# Patient Record
Sex: Male | Born: 1965 | Race: Black or African American | Hispanic: No | Marital: Married | Smoking: Current every day smoker
Health system: Southern US, Community
[De-identification: ages and names within clinical notes are randomized; demographics above are authoritative.]

## PROBLEM LIST (undated history)

## (undated) DIAGNOSIS — I10 Essential (primary) hypertension: Secondary | ICD-10-CM

## (undated) DIAGNOSIS — E785 Hyperlipidemia, unspecified: Secondary | ICD-10-CM

## (undated) HISTORY — DX: Essential (primary) hypertension: I10

## (undated) HISTORY — DX: Hyperlipidemia, unspecified: E78.5

---

## 2005-09-02 ENCOUNTER — Other Ambulatory Visit: Payer: Self-pay

## 2005-09-02 ENCOUNTER — Emergency Department: Payer: Self-pay | Admitting: Emergency Medicine

## 2007-04-15 ENCOUNTER — Emergency Department: Payer: Self-pay | Admitting: Emergency Medicine

## 2007-04-16 ENCOUNTER — Emergency Department: Payer: Self-pay | Admitting: Emergency Medicine

## 2007-04-23 ENCOUNTER — Emergency Department: Payer: Self-pay | Admitting: Emergency Medicine

## 2008-05-13 ENCOUNTER — Emergency Department: Payer: Self-pay | Admitting: Unknown Physician Specialty

## 2009-05-01 ENCOUNTER — Emergency Department: Payer: Self-pay | Admitting: Unknown Physician Specialty

## 2014-06-10 ENCOUNTER — Emergency Department: Payer: Self-pay | Admitting: Emergency Medicine

## 2014-11-16 NOTE — Consult Note (Signed)
Brief Consult Note: Diagnosis: Displaced right Colles fracture.   Patient was seen by consultant.   Recommend to proceed with surgery or procedure.   Recommend further assessment or treatment.   Orders entered.   Discussed with Attending MD.   Comments: 49 year old male fell yesterday in17juring the right wrist.  Presented to Emergency Room today wr=here exam and X-rays show a displaced right distal radius fracture.    Exam:  Alert and cooperative.  circulation/sensation/motor function good.  mild deformity.  Tendeer to palpation.  Skin intact.  No median nerve signs.   X-rays: as above  Rx:  closed reduction with sugar tong splint---- post reduction X-rays show good length, no angulation,  slight posterior offset         Ice, elevate, Norco 7.5/325 mg        re-ray 3-4 days.  Advised patient that surgery might be necessary if fracture moves.  Electronic Signatures: Valinda HoarMiller, Sonoma Firkus E (MD)  (Signed [id number]-Nov-15 15:30)  Authored: Brief Consult Note   Last Updated: 16-Nov-15 15:30 by Valinda HoarMiller, Rashidi Loh E (MD)

## 2016-07-12 IMAGING — CR DG WRIST COMPLETE 3+V*R*
1 series · 4 of 4 positions shown · non-contrast
Comparison: None.

CLINICAL DATA: Fell down a flight of stairs.  Right wrist pain.

EXAM:
RIGHT WRIST - COMPLETE 3+ VIEW

[Series 1: dxr wrist rt comp with obliques · 0.14mm/px · 4 of 4 slices shown]
[im 1/4]
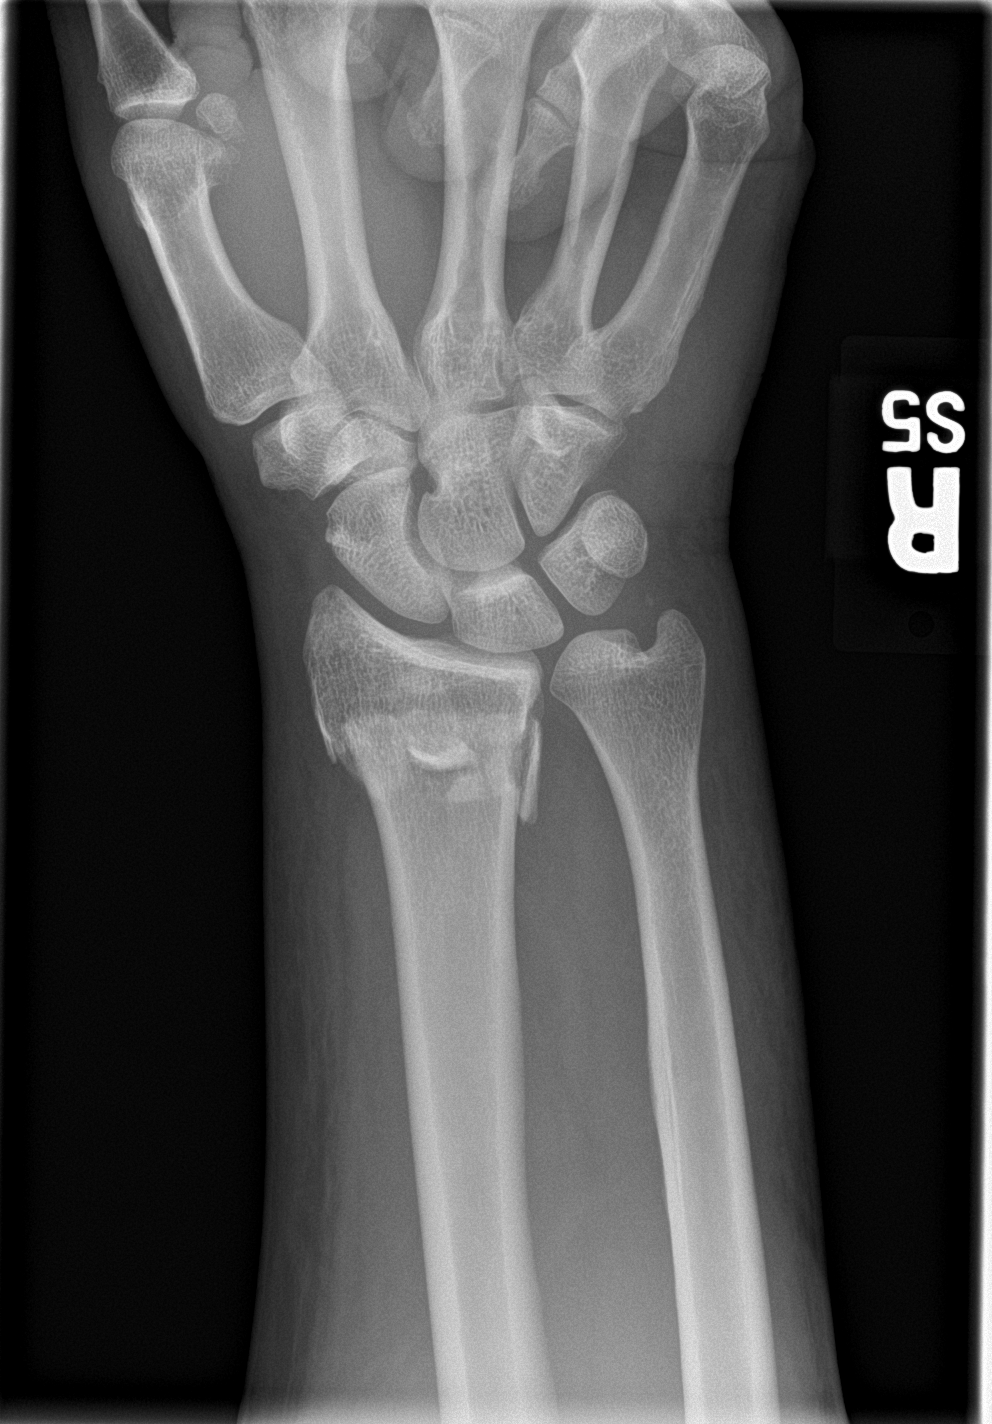
[im 2/4]
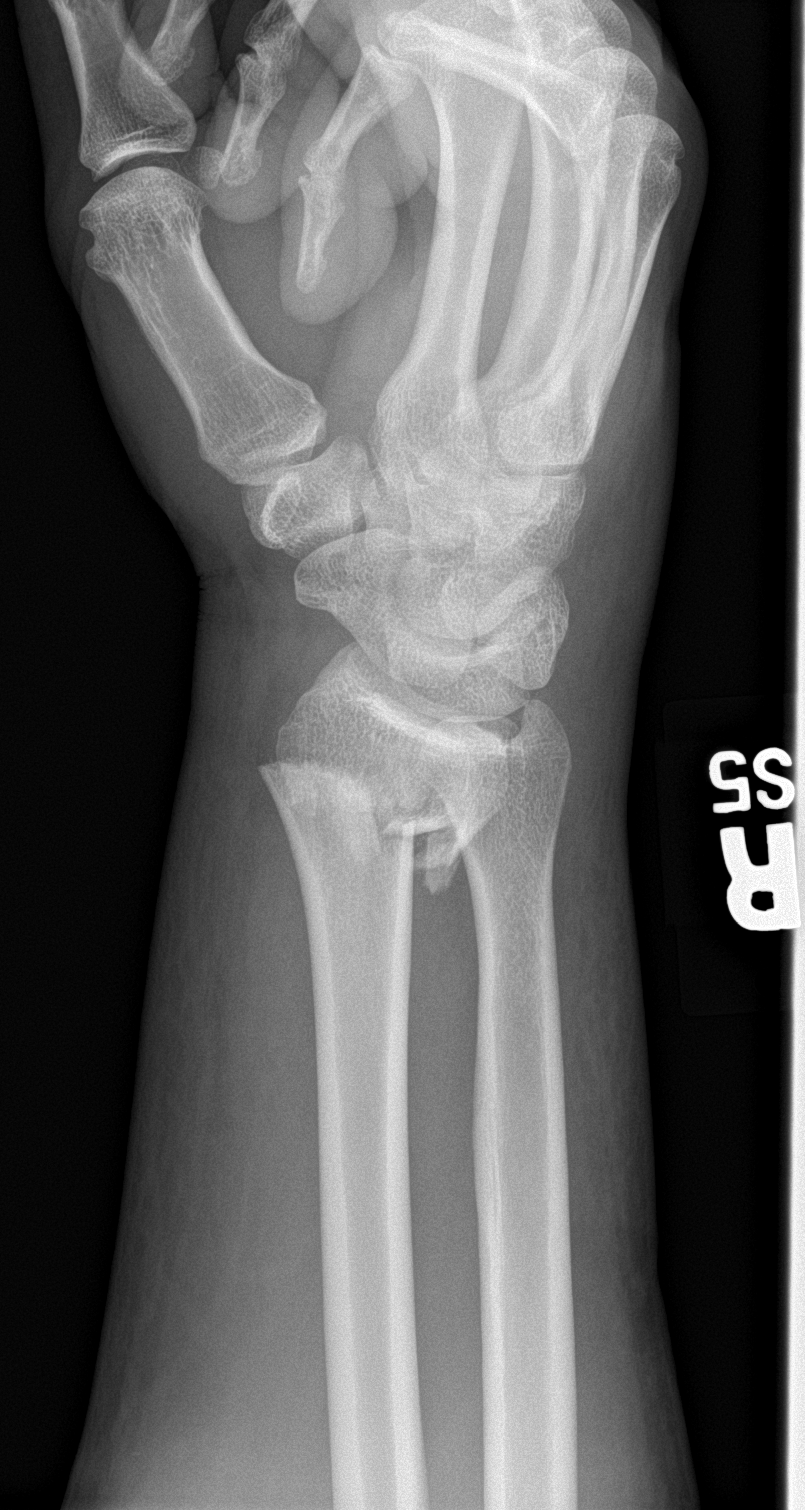
[im 3/4]
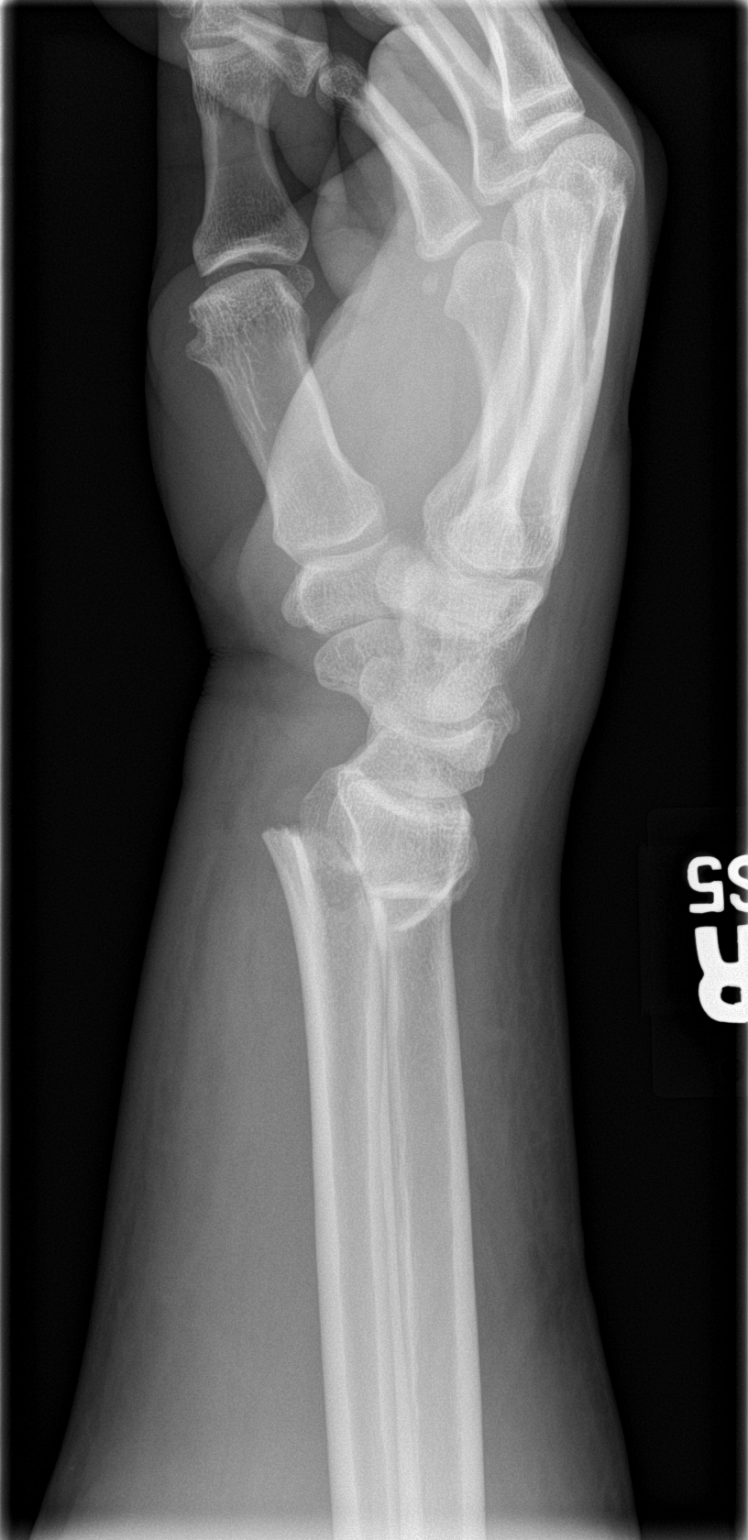
[im 4/4]
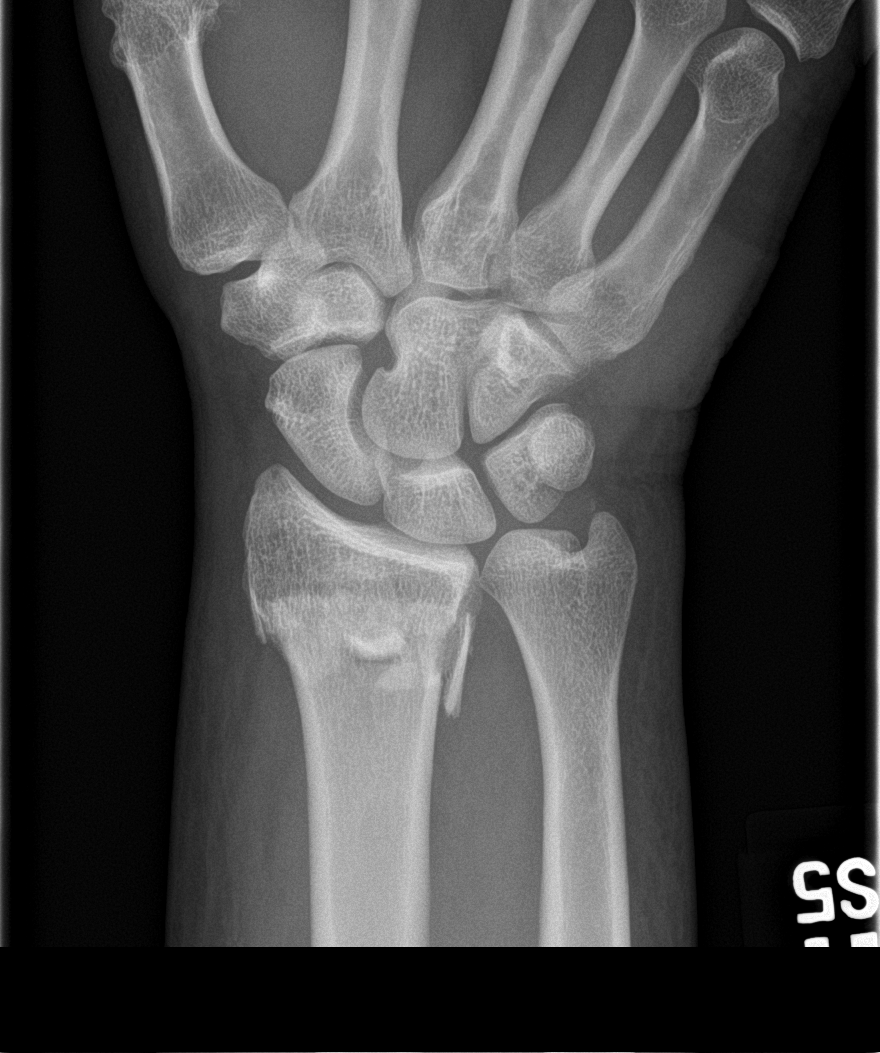

[4 of 4 positions shown; findings below may reference images not displayed]

FINDINGS: There is a comminuted fracture of the distal radial metaphysis. The
areas 6 mm of posterior displacement. There is apex volar angulation
with dorsal tilting of the distal radial major fracture fragment.
There is a tiny ossific fragment just distal to the tip of the ulnar
styloid process which likely reflects a avulsion fracture. There is
no other fracture or dislocation. There is soft tissue swelling
around the right wrist.
IMPRESSION: 1. Comminuted fracture of the distal right radial metaphysis with 6
mm of posterior displacement and apex volar angulation.
2. Tiny avulsive fracture fragment at the tip of the ulnar styloid
process.

## 2016-07-12 IMAGING — CR DG WRIST 2V*R*
1 series · 2 of 2 positions shown · non-contrast
Comparison: 06/10/2014

CLINICAL DATA: Status post reduction

EXAM:
RIGHT WRIST - 2 VIEW

[Series 1: pa · 0.17mm/px · 2 of 2 slices shown]
[im 1/2]
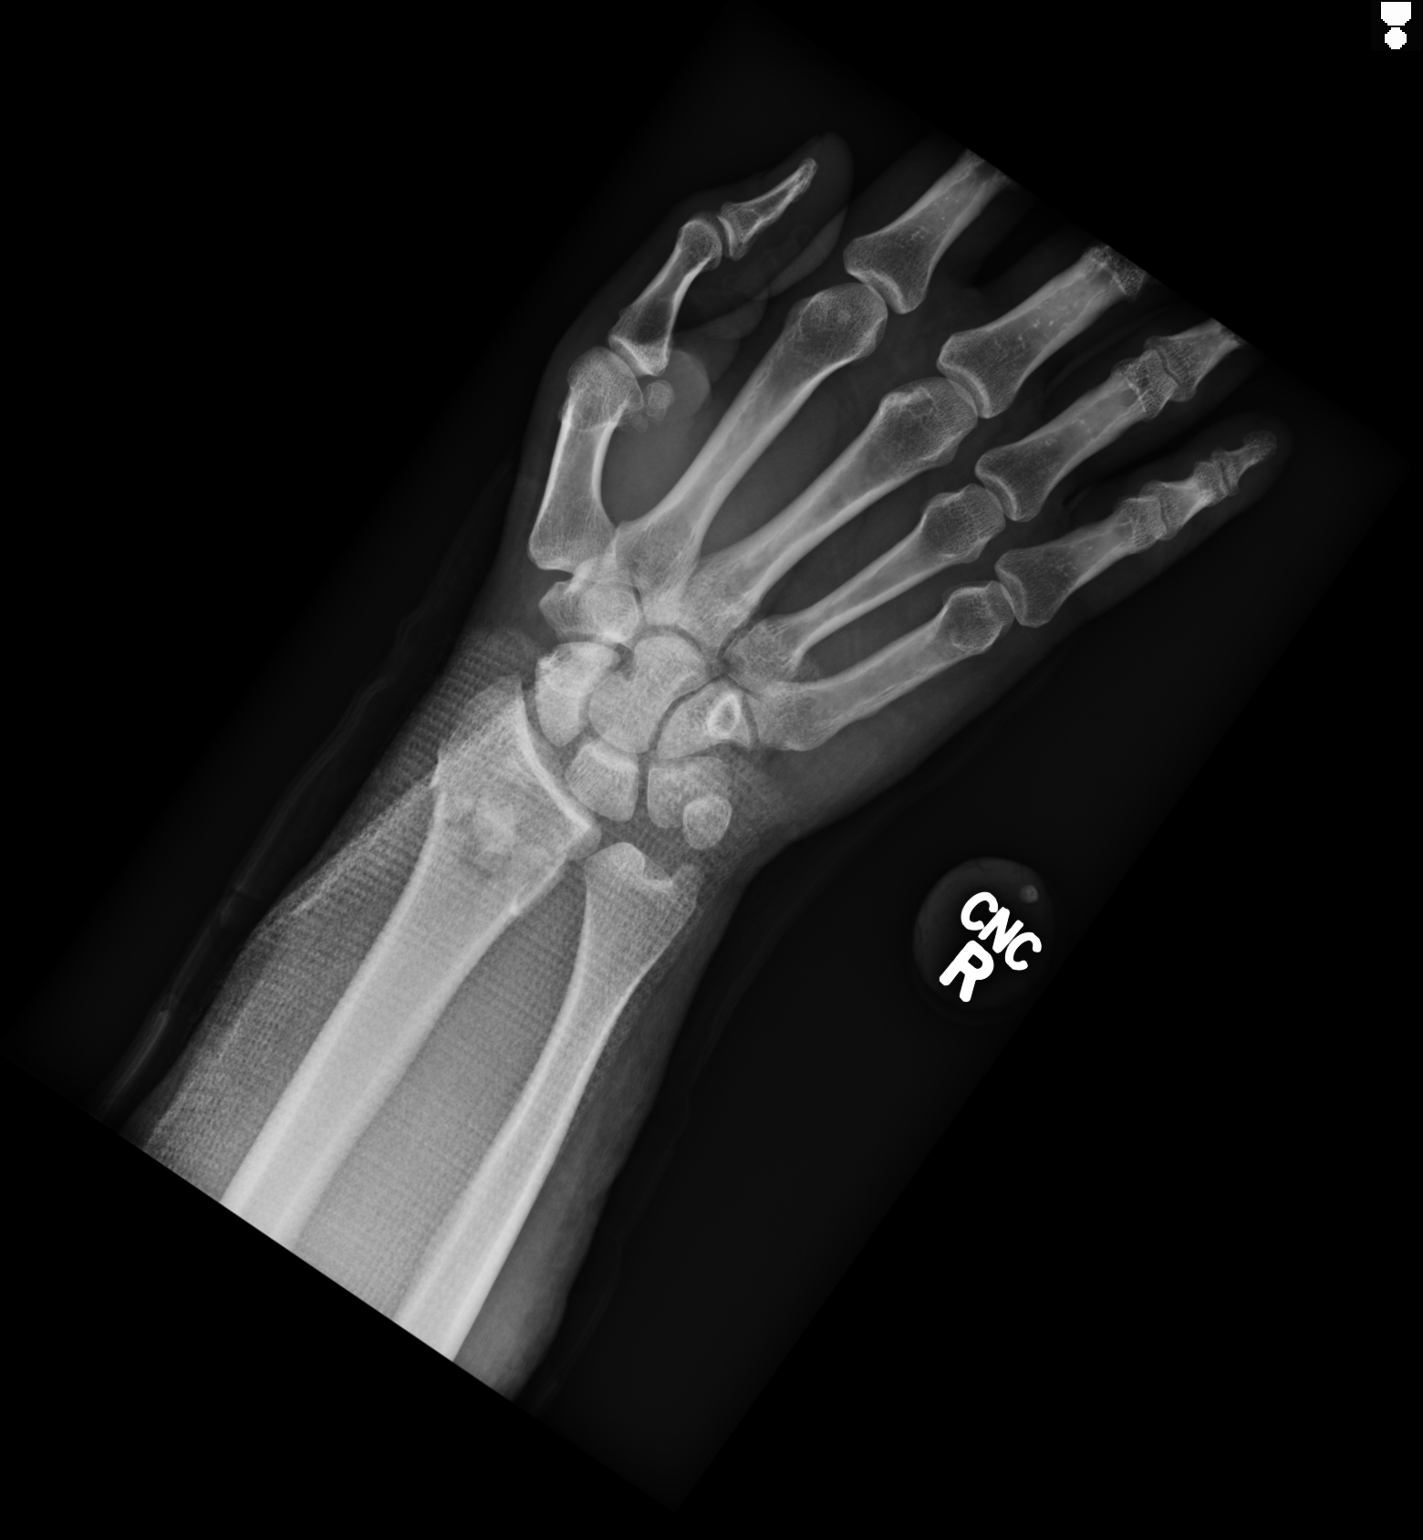
[im 2/2]
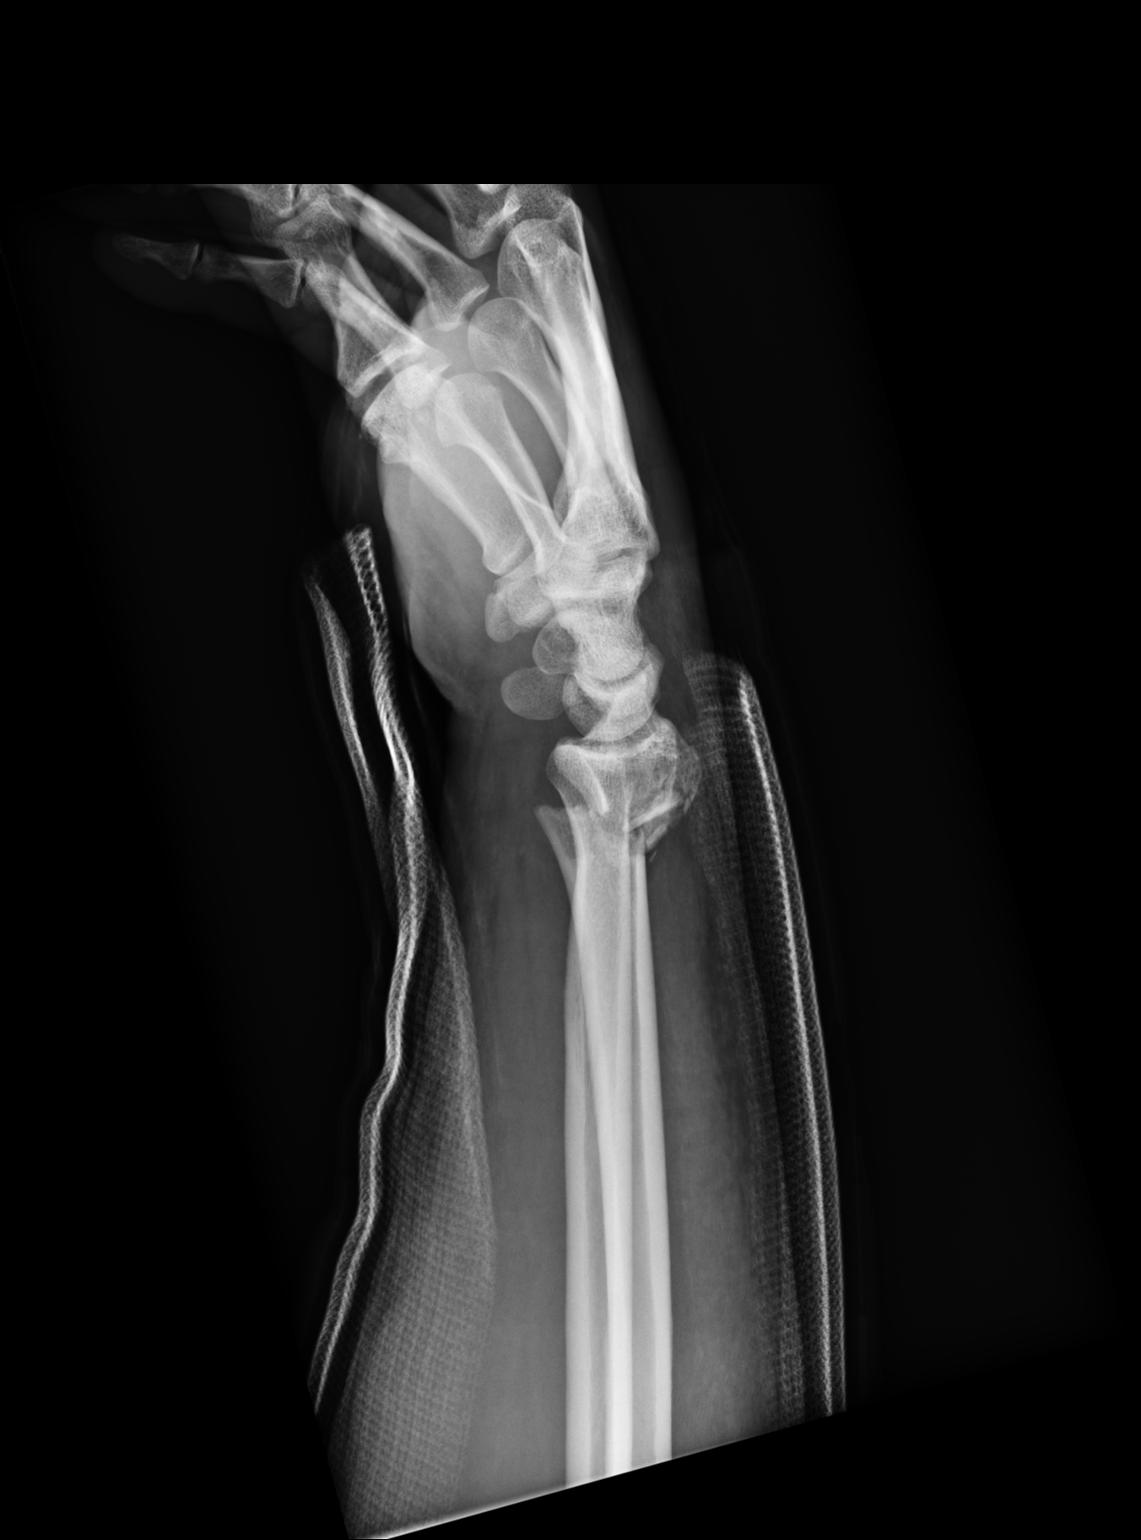

[2 of 2 positions shown; findings below may reference images not displayed]

FINDINGS: There is been interval close reduction of the comminuted, impacted
fracture involving the distal radius. There is continued dorsal
displacement of the distal fracture fragments.
IMPRESSION: 1. Status post attempted reduction of distal radius fracture.
Continued dorsal displacement of the distal fracture fragments
noted.

## 2022-11-17 NOTE — Progress Notes (Signed)
 Patient ID: Herbert Watson is a 57 y.o. male who presents for follow up of multiple medical problems. Informant: Patient came to appointment alone. Assessment/Plan:  Erectile dysfunction, unspecified erectile dysfunction type Assessment & Plan: Refilled Viagra 25 mg.  Patient given good Rx coupon.   Essential hypertension Assessment & Plan: Blood pressure still running a little bit high.  Discussed increasing amlodipine to 10 mg p.o. today.  For right now patient wants to maintain amlodipine at 5 mg p.o. daily and continue to work on low-sodium diet and exercise as well as weight loss.  Patient to get a blood pressure monitor at home and start checking blood pressure readings.  Requested patient send a MyChart messages with blood pressure readings so we can get under control.  Check electrolytes BUN/creatinine today.  Orders: -     Comprehensive Metabolic Panel -     amLODIPine; Take 1 tablet (5 mg total) by mouth daily.  Hyperlipidemia, unspecified hyperlipidemia type Assessment & Plan: Continue atorvastatin 20 mg p.o. daily.  Check lipid profile and ALT a today.  Orders: -     Lipid Panel -     atorvastatin; Take 1 tablet (20 mg total) by mouth daily.  Prediabetes Assessment & Plan: Work on diet and exercise.  Concerned that A1c is can be a little bit high today.  Will check labs today.  Cut out liquid calories and concentrated sweet beverages.  Orders: -     Hemoglobin A1c  Screening for blood disease -     CBC  High risk medication use -     Comprehensive Metabolic Panel  Tobacco abuse counseling Assessment & Plan: Patient has cut back dramatically however would like to have him stop.  Patient understands there are medications available to assist with cravings.  Currently not interested in nicotine replacement or medications.  Patient was aware of the 1 800 quit now line.  Continue to encourage patient to quit tobacco and inquire at each and every visit.  Orders: -      Ambulatory referral to Quit Line; Future  Screening for prostate cancer -     PSA (Prostate Specific Antigen); Future  Vaccine counseling Assessment & Plan: Due for Shingrix and Pneumovax.  Wants to defer to next visit.   Intermittent constipation Assessment & Plan: The patient's constipation may be a side effect of Norvasc. The patient is advised to take Benefiber, increase fluid intake, and consider consuming prune juice. A daily water intake of 60 ounces or 2 liters is recommended.   BMI 32.0-32.9,adult Assessment & Plan: The patient's Body Mass Index (BMI) is currently above the ideal range. The patient is advised to aim for a weight below 200 pounds, with a long-term goal of reaching 185 pounds. A trial period of 2 weeks without liquid calories is recommended. The patient is also encouraged to increase physical activity.   Other orders -     blood pressure monitor; 1 kit by Miscellaneous route in the morning. -     sildenafiL; Take 1 tablet (25 mg total) by mouth daily as needed for erectile dysfunction.   Preventive services addressed today Prevnar 20 (PCV20): deferred until later visit Shingrix: deferred until later visit Diabetes screening: ordered Lipid screening: ordered PSA: risks and benefits of prostate cancer screening were carefully reviewed -- ordered -- Patient verbalized an understanding of today's assessment and recommendations, as well as the purpose of ongoing medications. Return in about 6 months (around 05/20/2023).   Subjective:  HPI History of Present  Illness The patient presents for evaluation of multiple medical concerns.  The patient has been utilizing sildenafil 20 mg as needed, occasionally doubling the dose. He reports that the medication has been effective and is seeking a refill.  The patient has experienced a weight gain of 10 pounds, increasing from 199 to 210. His dietary habits include fast food consumption and a sedentary lifestyle.  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 His morning routine includes a a pack of Toasties, and a non-diet SunDrop. He also consumes 4 to 5 light beers per week. He denies any symptoms of hyperglycemia such as polydipsia or polyuria, except when consuming beer. His sleep quality was poor the previous night due to a disagreement with his wife, but he generally sleeps well. He admits to snoring, which has not worsened with the recent weight gain. He denies any episodes of apnea or daytime somnolence. He also denies experiencing headaches or dizziness upon awakening.  The patient's blood pressure readings have shown a decrease from 150/90 to 148/80. He does not currently own a home blood pressure monitor.  Supplemental Information He denies any shortness of breath, cough, chest pain, heart racing, nausea, or vomiting. His bowel movements are a little bit better, but he is constipated sometimes.  . He has cut back on smoking to 1 or 2 cigarettes per week.  This patients last WCC/CPE date: : 01/14/2021  Blood pressure still running a little bit high.  Hyperlipidemia atorvastatin (LIPITOR) 20 MG tablet   Erectile dysfunction  Prediabetes  Tobacco use ROS A comprehensive review of systems was conducted with negative results except as noted in the HPI above.      Objective:  Vital Signs BP 138/80   Pulse 84   Ht 172.2 cm (5' 7.8)   Wt 95.6 kg (210 lb 11.2 oz)   SpO2 98%   BMI 32.23 kg/m  Body mass index is 32.23 kg/m. Exam Physical Exam Constitutional:      Appearance: Normal appearance.  HENT:     Head: Normocephalic and atraumatic.  Cardiovascular:     Rate and Rhythm: Normal rate and regular rhythm.     Pulses: Normal pulses.     Heart sounds: Normal heart sounds.  Pulmonary:     Effort: Pulmonary effort is normal.     Breath sounds: Normal breath sounds.   Abdominal:     General: Abdomen is flat. Bowel sounds are normal.     Palpations: Abdomen is soft.  Musculoskeletal:     Right lower leg: No edema.     Left lower leg: No edema.  Lymphadenopathy:     Cervical: No cervical adenopathy.     Upper Body:     Right upper body: No supraclavicular or axillary adenopathy.     Left upper body: No supraclavicular or axillary adenopathy.  Skin:    General: Skin is warm.  Neurological:     General: No focal deficit present.     Mental Status: He is alert. Mental status is at baseline.  Psychiatric:        Mood and Affect: Mood normal.        Behavior: Behavior normal.        Thought Content: Thought content normal.        I've personally reviewed and summarized records in EPIC/Media and via CareEverywhere as well as medication, allergies, past medical, social, and family history.  I have  reviewed the patient's medical history in detail and updated the computerized patient record.  Note - This chart has been prepared using the Dragon voice recognition system. Typographical errors may have occurred.  Attempts have been made to correct errors, however, inadvertent errors may persist.

## 2024-03-14 NOTE — Progress Notes (Signed)
 " New patient visit  Patient: Herbert Watson   DOB: [birth year]   58 y.o. Male  MRN: [id number] Visit Date: 03/15/2024  Today's healthcare provider: Jolynn Spencer, PA-C   Chief Complaint  Patient presents with   New Patient (Initial Visit)    Patient is present to establish care with new provider due to last office not being insurance network    Care Management    Pneumococcal Vaccine declined Colonoscopy - completed 05/04/2021 Zoster Vaccine declined Hepatitis B Vaccines overdue since 08/06/23   Subjective    Herbert Watson is a 58 y.o. male who presents today as a new patient to establish care.   Discussed the use of AI scribe software for clinical note transcription with the patient, who gave verbal consent to proceed.  History of Present Illness Herbert Watson is a 58 year old male who presents to establish care with a new provider.  He has hypertension, hyperlipidemia, prediabetes, and weight management issues. He takes atorvastatin  40 mg for cholesterol and amlodipine 5 mg daily for blood pressure. He experiences occasional nocturnal wheezing, possibly related to his weight. He completed a colonoscopy in 2022, which revealed two polyps. He smokes cigars three times a week and occasionally uses marijuana. He consumes alcohol, including moonshine and beer, about three times a week. He is actively trying to lose weight by walking a mile every morning. He experiences snoring but does not feel unrefreshed in the morning and has not been told he gasps for air during sleep. He has no known family medical history due to adoption.    Past Medical History:  Diagnosis Date   Hyperlipidemia    Hypertension    The histories are not reviewed yet. Please review them in the History navigator section and refresh this SmartLink. No family status information on file.   No family history on file. Social History   Socioeconomic History   Marital status: Married    Spouse name: Not on file    Number of children: Not on file   Years of education: Not on file   Highest education level: Not on file  Occupational History   Not on file  Tobacco Use   Smoking status: Every Day    Types: Cigars   Smokeless tobacco: Never  Vaping Use   Vaping status: Never Used  Substance and Sexual Activity   Alcohol use: Not on file   Drug use: Not on file   Sexual activity: Not on file  Other Topics Concern   Not on file  Social History Narrative   Not on file   Social Drivers of Health   Financial Resource Strain: Medium Risk (11/18/2022)   Received from [facility]   Overall Financial Resource Strain (CARDIA)    Difficulty of Paying Living Expenses: Somewhat hard  Food Insecurity: No Food Insecurity (11/18/2022)   Received from [facility] [place]   Hunger Vital Sign    Within the past 12 months, you worried that your food would run out before you got the money to buy more.: Never true    Within the past 12 months, the food you bought just didn't last and you didn't have money to get more.: Never true  Transportation Needs: No Transportation Needs (11/18/2022)   Received from [facility]   PRAPARE - Transportation    Lack of Transportation (Medical): No    Lack of Transportation (Non-Medical): No  Physical Activity: Not on file  Stress: Not on file  Social Connections: Not  on file   Outpatient Medications Prior to Visit  Medication Sig   amLODipine (NORVASC) 5 MG tablet Take 5 mg by mouth.   atorvastatin  (LIPITOR) 40 MG tablet TAKE 1 TABLET BY MOUTH DAILY. YOU NEED FOLLOW UP WITH YOUR PRIMARY FOR MORE REFILLS   No facility-administered medications prior to visit.   Allergies  Allergen Reactions   Penicillins     As a child    Immunization History  Administered Date(s) Administered   Dtap, Unspecified 05/14/1968, 07/09/1968, 08/13/1968   Hep A / Hep B 07/09/2003   Measles 04/03/1969, 03/23/1971   Polio, Unspecified 05/14/1968, 07/09/1968, 02/11/1969   Rubella  03/22/1972   Tdap 01/14/2021    Health Maintenance  Topic Date Due   COVID-19 Vaccine (1) Never done   HIV Screening  Never done   Hepatitis C Screening  Never done   Pneumococcal Vaccine: 50+ Years (1 of 2 - PCV) Never done   Zoster Vaccines- Shingrix (1 of 2) Never done   Hepatitis B Vaccines 19-59 Average Risk (2 of 3 - 19+ 3-dose series) 08/06/2003   Colonoscopy  Never done   INFLUENZA VACCINE  02/24/2024   DTaP/Tdap/Td (5 - Td or Tdap) 01/15/2031   HPV VACCINES  Aged Out   Meningococcal B Vaccine  Aged Out    Patient Care Team: Curlee Bogan, PA-C as PCP - General (Physician Assistant)  Review of Systems  All other systems reviewed and are negative.  Except see HPI       Objective    BP (!) 170/87 (BP Location: Left Arm, Patient Position: Sitting, Cuff Size: Large)   Pulse 92   Ht 5' 6 (1.676 m)   Wt 236 lb 6.4 oz (107.2 kg)   SpO2 100%   BMI 38.16 kg/m     Physical Exam Vitals reviewed.  Constitutional:      General: He is not in acute distress.    Appearance: Normal appearance. He is not diaphoretic.  HENT:     Head: Normocephalic and atraumatic.  Eyes:     General: No scleral icterus.    Conjunctiva/sclera: Conjunctivae normal.  Cardiovascular:     Rate and Rhythm: Normal rate and regular rhythm.     Pulses: Normal pulses.     Heart sounds: Normal heart sounds. No murmur heard. Pulmonary:     Effort: Pulmonary effort is normal. No respiratory distress.     Breath sounds: Normal breath sounds. No wheezing or rhonchi.  Musculoskeletal:     Cervical back: Neck supple.     Right lower leg: No edema.     Left lower leg: No edema.  Lymphadenopathy:     Cervical: No cervical adenopathy.  Skin:    General: Skin is warm and dry.     Findings: No rash.  Neurological:     Mental Status: He is alert and oriented to person, place, and time. Mental status is at baseline.  Psychiatric:        Mood and Affect: Mood normal.        Behavior: Behavior  normal.     Depression Screen    03/15/2024    3:04 PM  PHQ 2/9 Scores  PHQ - 2 Score 0   No results found for any visits on 03/15/24.  Assessment & Plan      Assessment & Plan Hypertension Chronic, uncontrolled Blood pressure elevated. Current regimen includes amlodipine 5 mg daily. Stress and new environment may contribute. Home monitoring necessary. Hypertensive urgency if BP exceeds  180. - Instructed to obtain home blood pressure monitor. - Measure BP every morning and evening. - Record BP readings with heart rate and contributing factors. - Bring monitor to next appointment for calibration. - Reassess BP in two weeks. - Consider additional antihypertensive if BP remains elevated.  Hyperlipidemia Chronic Managed with atorvastatin  40 mg. Continue low-cholesterol diet and regular exercise We will follow-up  Prediabetes Management changes discussed. - Order A1c test to assess glycemic control. Continue low-carb diet and daily exercise Will follow-up  Obesity Chronic and stable Body mass index is 38.16 kg/m. Acknowledged need for weight loss. Started walking a mile daily. Previous weight loss regained due to lifestyle changes. - Encourage continuation of daily walking. - Discuss importance of diet and exercise in weight management. Will follow-up  Tobacco use Smokes cigars approximately every three days. Consider Wellbutrin for smoking cessation and weight loss  Cannabis use Uses cannabis for relaxation approximately every three days. Cessation advised  Alcohol use, at risk Consumes alcohol approximately every three days, including moonshine/x 1 and beer/x 2. Discussed as a potential risk factor. - Encourage reduction in alcohol consumption. Will follow-up  Snoring Reports snoring without significant sleep apnea symptoms. Slight possibility of sleep apnea considered. - Monitor for sleep apnea symptoms, including snoring patterns and morning fatigue. -  Consider sleep study if suggestive symptoms develop.  Encounter to establish care Welcomed to our clinic Reviewed past medical hx, social hx, family hx and surgical hx Pt advised to send all vaccination records or screening  Primary hypertension (Primary)  - Lipid panel - Hemoglobin A1c - TSH - Comprehensive metabolic panel with GFR - CBC with Differential/Platelet - hydrochlorothiazide  (MICROZIDE ) 12.5 MG capsule; Take 1 capsule (12.5 mg total) by mouth daily.  Dispense: 30 capsule; Refill: 1  Other hyperlipidemia  - Lipid panel - Hemoglobin A1c - TSH - Comprehensive metabolic panel with GFR - CBC with Differential/Platelet  Prediabetes  - Lipid panel - Hemoglobin A1c - TSH - Comprehensive metabolic panel with GFR - CBC with Differential/Platelet  Obesity (BMI 30-39.9)  - Lipid panel - Hemoglobin A1c - TSH - Comprehensive metabolic panel with GFR - CBC with Differential/Platelet  Encounter for special screening examination for cardiovascular disorder -lp  Prostate cancer screening  - PSA Total (Reflex To Free) ([facility] only)   No follow-ups on file.    The patient was advised to call back or seek an in-person evaluation if the symptoms worsen or if the condition fails to improve as anticipated.  I discussed the assessment and treatment plan with the patient. The patient was provided an opportunity to ask questions and all were answered. The patient agreed with the plan and demonstrated an understanding of the instructions.  I, Shaniyah Wix, PA-C have reviewed all documentation for this visit. The documentation on  03/15/2024   for the exam, diagnosis, procedures, and orders are all accurate and complete.  Jolynn Spencer, [facility], MMS [facility] [phone] (phone) [phone] (fax)  [facility] "

## 2024-03-15 ENCOUNTER — Encounter: Payer: Self-pay | Admitting: Physician Assistant

## 2024-03-15 ENCOUNTER — Ambulatory Visit (INDEPENDENT_AMBULATORY_CARE_PROVIDER_SITE_OTHER): Payer: Self-pay | Admitting: Physician Assistant

## 2024-03-15 VITALS — BP 164/87 | HR 81 | Ht 66.0 in | Wt 236.4 lb

## 2024-03-15 DIAGNOSIS — F1290 Cannabis use, unspecified, uncomplicated: Secondary | ICD-10-CM | POA: Insufficient documentation

## 2024-03-15 DIAGNOSIS — R7303 Prediabetes: Secondary | ICD-10-CM | POA: Diagnosis not present

## 2024-03-15 DIAGNOSIS — E669 Obesity, unspecified: Secondary | ICD-10-CM | POA: Insufficient documentation

## 2024-03-15 DIAGNOSIS — F17200 Nicotine dependence, unspecified, uncomplicated: Secondary | ICD-10-CM | POA: Insufficient documentation

## 2024-03-15 DIAGNOSIS — I10 Essential (primary) hypertension: Secondary | ICD-10-CM | POA: Diagnosis not present

## 2024-03-15 DIAGNOSIS — R0683 Snoring: Secondary | ICD-10-CM | POA: Insufficient documentation

## 2024-03-15 DIAGNOSIS — N529 Male erectile dysfunction, unspecified: Secondary | ICD-10-CM

## 2024-03-15 DIAGNOSIS — E7849 Other hyperlipidemia: Secondary | ICD-10-CM | POA: Diagnosis not present

## 2024-03-15 DIAGNOSIS — Z125 Encounter for screening for malignant neoplasm of prostate: Secondary | ICD-10-CM | POA: Insufficient documentation

## 2024-03-15 DIAGNOSIS — Z7185 Encounter for immunization safety counseling: Secondary | ICD-10-CM

## 2024-03-15 DIAGNOSIS — Z79899 Other long term (current) drug therapy: Secondary | ICD-10-CM

## 2024-03-15 DIAGNOSIS — Z9189 Other specified personal risk factors, not elsewhere classified: Secondary | ICD-10-CM | POA: Insufficient documentation

## 2024-03-15 DIAGNOSIS — Z7689 Persons encountering health services in other specified circumstances: Secondary | ICD-10-CM

## 2024-03-15 DIAGNOSIS — Z136 Encounter for screening for cardiovascular disorders: Secondary | ICD-10-CM

## 2024-03-15 MED ORDER — HYDROCHLOROTHIAZIDE 12.5 MG PO CAPS: 12.5000 mg | ORAL_CAPSULE | Freq: Every day | ORAL | 1 refills | Status: DC

## 2024-03-29 ENCOUNTER — Ambulatory Visit: Admitting: Physician Assistant

## 2024-04-03 ENCOUNTER — Ambulatory Visit: Admitting: Physician Assistant

## 2024-04-03 NOTE — Progress Notes (Deleted)
 Established patient visit  Patient: Herbert Watson   DOB: 1966/04/21   58 y.o. Male  MRN: [id number] Visit Date: 04/03/2024  Today's healthcare provider: Jolynn Spencer, PA-C   No chief complaint on file.  Subjective       Discussed the use of AI scribe software for clinical note transcription with the patient, who gave verbal consent to proceed.  History of Present Illness        03/15/2024    3:04 PM  Depression screen PHQ 2/9  Decreased Interest 0  Down, Depressed, Hopeless 0  PHQ - 2 Score 0      03/15/2024    3:04 PM  GAD 7 : Generalized Anxiety Score  Nervous, Anxious, on Edge 0  Control/stop worrying 0  Worry too much - different things 0  Trouble relaxing 0  Restless 0  Easily annoyed or irritable 0  Afraid - awful might happen 0  Total GAD 7 Score 0  Anxiety Difficulty Not difficult at all    Medications: Outpatient Medications Prior to Visit  Medication Sig  . amLODipine (NORVASC) 5 MG tablet Take 5 mg by mouth.  . atorvastatin  (LIPITOR) 40 MG tablet TAKE 1 TABLET BY MOUTH DAILY. YOU NEED FOLLOW UP WITH YOUR PRIMARY FOR MORE REFILLS  . hydrochlorothiazide  (MICROZIDE ) 12.5 MG capsule Take 1 capsule (12.5 mg total) by mouth daily.   No facility-administered medications prior to visit.    Review of Systems  All other systems reviewed and are negative.  All negative Except see HPI   {Insert previous labs (optional):23779} {See past labs  Heme  Chem  Endocrine  Serology  Results Review (optional):1}   Objective    There were no vitals taken for this visit. {Insert last BP/Wt (optional):23777}{See vitals history (optional):1}   Physical Exam Vitals reviewed.  Constitutional:      General: He is not in acute distress.    Appearance: Normal appearance. He is not diaphoretic.  HENT:     Head: Normocephalic and atraumatic.  Eyes:     General: No scleral icterus.    Conjunctiva/sclera: Conjunctivae normal.  Cardiovascular:     Rate and  Rhythm: Normal rate and regular rhythm.     Pulses: Normal pulses.     Heart sounds: Normal heart sounds. No murmur heard. Pulmonary:     Effort: Pulmonary effort is normal. No respiratory distress.     Breath sounds: Normal breath sounds. No wheezing or rhonchi.  Musculoskeletal:     Cervical back: Neck supple.     Right lower leg: No edema.     Left lower leg: No edema.  Lymphadenopathy:     Cervical: No cervical adenopathy.  Skin:    General: Skin is warm and dry.     Findings: No rash.  Neurological:     Mental Status: He is alert and oriented to person, place, and time. Mental status is at baseline.  Psychiatric:        Mood and Affect: Mood normal.        Behavior: Behavior normal.     No results found for any visits on 04/03/24.      Assessment and Plan Assessment & Plan     No orders of the defined types were placed in this encounter.   No follow-ups on file.   The patient was advised to call back or seek an in-person evaluation if the symptoms worsen or if the condition fails to improve as anticipated.  I discussed the assessment and  treatment plan with the patient. The patient was provided an opportunity to ask questions and all were answered. The patient agreed with the plan and demonstrated an understanding of the instructions.  I, Nayelly Laughman, PA-C have reviewed all documentation for this visit. The documentation on 04/03/2024  for the exam, diagnosis, procedures, and orders are all accurate and complete.  Jolynn Spencer, [facility], MMS [facility] [phone] (phone) [phone] (fax)  [facility]

## 2024-04-04 LAB — LIPID PANEL
Chol/HDL Ratio: 2.4 ratio (ref 0.0–5.0)
Cholesterol, Total: 224 mg/dL — ABNORMAL HIGH (ref 100–199)
HDL: 93 mg/dL (ref >39)
LDL Chol Calc (NIH): 114 mg/dL — ABNORMAL HIGH (ref 0–99)
Triglycerides: 97 mg/dL (ref 0–149)
VLDL Cholesterol Cal: 17 mg/dL (ref 5–40)

## 2024-04-04 LAB — CBC WITH DIFFERENTIAL/PLATELET
Basophils Absolute: 0.1 x10E3/uL (ref 0.0–0.2)
Basos: 1 %
EOS (ABSOLUTE): 0.4 x10E3/uL (ref 0.0–0.4)
Eos: 7 %
Hematocrit: 47.9 % (ref 37.5–51.0)
Hemoglobin: 15.4 g/dL (ref 13.0–17.7)
Immature Grans (Abs): 0 x10E3/uL (ref 0.0–0.1)
Immature Granulocytes: 0 %
Lymphocytes Absolute: 2.5 x10E3/uL (ref 0.7–3.1)
Lymphs: 40 %
MCH: 27.5 pg (ref 26.6–33.0)
MCHC: 32.2 g/dL (ref 31.5–35.7)
MCV: 86 fL (ref 79–97)
Monocytes Absolute: 0.4 x10E3/uL (ref 0.1–0.9)
Monocytes: 6 %
Neutrophils Absolute: 3 x10E3/uL (ref 1.4–7.0)
Neutrophils: 46 %
Platelets: 177 x10E3/uL (ref 150–450)
RBC: 5.6 x10E6/uL (ref 4.14–5.80)
RDW: 13.6 % (ref 11.6–15.4)
WBC: 6.4 x10E3/uL (ref 3.4–10.8)

## 2024-04-04 LAB — COMPREHENSIVE METABOLIC PANEL WITH GFR
ALT: 22 IU/L (ref 0–44)
AST: 22 IU/L (ref 0–40)
Albumin: 4.6 g/dL (ref 3.8–4.9)
Alkaline Phosphatase: 61 IU/L (ref 44–121)
BUN/Creatinine Ratio: 13 (ref 9–20)
BUN: 12 mg/dL (ref 6–24)
Bilirubin Total: 0.8 mg/dL (ref 0.0–1.2)
CO2: 21 mmol/L (ref 20–29)
Calcium: 9.4 mg/dL (ref 8.7–10.2)
Chloride: 102 mmol/L (ref 96–106)
Creatinine, Ser: 0.93 mg/dL (ref 0.76–1.27)
Globulin, Total: 2.4 g/dL (ref 1.5–4.5)
Glucose: 102 mg/dL — ABNORMAL HIGH (ref 70–99)
Potassium: 4 mmol/L (ref 3.5–5.2)
Sodium: 138 mmol/L (ref 134–144)
Total Protein: 7 g/dL (ref 6.0–8.5)
eGFR: 96 mL/min/1.73 (ref >59)

## 2024-04-04 LAB — PSA TOTAL (REFLEX TO FREE): Prostate Specific Ag, Serum: 1 ng/mL (ref 0.0–4.0)

## 2024-04-04 LAB — HEMOGLOBIN A1C
Est. average glucose Bld gHb Est-mCnc: 123 mg/dL
Hgb A1c MFr Bld: 5.9 % — ABNORMAL HIGH (ref 4.8–5.6)

## 2024-04-04 LAB — TSH: TSH: 2.15 u[IU]/mL (ref 0.450–4.500)

## 2024-04-05 ENCOUNTER — Telehealth: Payer: Self-pay | Admitting: Physician Assistant

## 2024-04-05 ENCOUNTER — Ambulatory Visit: Payer: Self-pay | Admitting: Physician Assistant

## 2024-04-05 NOTE — Progress Notes (Signed)
 If pt agrees,  Rx for atorvastatin  10mg  , take 1 tab by mouth at bedtime, #90, refill 0 Pt needs to follow-up in 6 weeks after starting a cholesterol medication. Advised him to schedule an appointment  Labs stable except  -elevated cholesterol with risk for heart attack and stroke of 25.1%. Advised start taking cholesterol medication. -elevated A1c, in prediabetes range. Advised low carb diet and daily exercise

## 2024-04-06 MED ORDER — ATORVASTATIN CALCIUM 10 MG PO TABS: 10.0000 mg | ORAL_TABLET | Freq: Every day | ORAL | 0 refills | Status: DC

## 2024-04-13 ENCOUNTER — Other Ambulatory Visit: Payer: Self-pay | Admitting: Physician Assistant

## 2024-04-13 DIAGNOSIS — E7849 Other hyperlipidemia: Secondary | ICD-10-CM

## 2024-04-18 ENCOUNTER — Other Ambulatory Visit: Payer: Self-pay | Admitting: Physician Assistant

## 2024-04-18 DIAGNOSIS — E7849 Other hyperlipidemia: Secondary | ICD-10-CM

## 2024-04-18 MED ORDER — ATORVASTATIN CALCIUM 40 MG PO TABS
ORAL_TABLET | ORAL | 3 refills | Status: AC
Start: 2024-04-18 — End: ?

## 2024-04-19 NOTE — Telephone Encounter (Signed)
 Spoke with pharmacist, they went ahead and reran through insurance and got it to cover. Pt should be able to get medication today, no need for another alternative.

## 2024-04-22 ENCOUNTER — Other Ambulatory Visit: Payer: Self-pay | Admitting: Physician Assistant

## 2024-04-22 DIAGNOSIS — I10 Essential (primary) hypertension: Secondary | ICD-10-CM
